# Patient Record
Sex: Male | Born: 1975 | Hispanic: No | Marital: Married | Smoking: Never smoker
Health system: Southern US, Community
[De-identification: ages and names within clinical notes are randomized; demographics above are authoritative.]

---

## 2015-05-29 ENCOUNTER — Ambulatory Visit (INDEPENDENT_AMBULATORY_CARE_PROVIDER_SITE_OTHER): Payer: Self-pay

## 2015-05-29 ENCOUNTER — Ambulatory Visit (INDEPENDENT_AMBULATORY_CARE_PROVIDER_SITE_OTHER): Payer: Self-pay | Admitting: Physician Assistant

## 2015-05-29 VITALS — BP 132/70 | HR 81 | Temp 98.7°F | Resp 16 | Ht 67.0 in | Wt 203.0 lb

## 2015-05-29 DIAGNOSIS — S62639A Displaced fracture of distal phalanx of unspecified finger, initial encounter for closed fracture: Secondary | ICD-10-CM

## 2015-05-29 DIAGNOSIS — S6991XA Unspecified injury of right wrist, hand and finger(s), initial encounter: Secondary | ICD-10-CM

## 2015-05-29 DIAGNOSIS — S62632A Displaced fracture of distal phalanx of right middle finger, initial encounter for closed fracture: Secondary | ICD-10-CM

## 2015-05-29 MED ORDER — CEPHALEXIN 500 MG PO CAPS: 500.0000 mg | ORAL_CAPSULE | Freq: Two times a day (BID) | ORAL | Status: AC

## 2015-05-29 NOTE — Progress Notes (Signed)
[facility] [address], [place] [id number] [phone]  Date:  05/29/2015   Name:  Jeremy Maxwell   DOB:  [birth year]   MRN:  [id number]  PCP:  No primary care provider on file.   Chief Complaint  Patient presents with  . Hand Pain    finger injury, smashed, a couple of days ago, right middle finger    History of Present Illness:  Jeremy Maxwell is a 40 y.o. male patient who [facility] cc of right middle finger pain. Language barrier, and wife is translating Patient states that about 2 weeks ago, he had smashed his middle finger between weights when they dropped down.  He had some bleeding, and increased pain in the beginning.  There was also swelling.  He has no numbness or tingling.  Became concerned when there was swelling and drainage.      There are no active problems to display for this patient.   History reviewed. No pertinent past medical history.  History reviewed. No pertinent past surgical history.  Social History  Substance Use Topics  . Smoking status: Never Smoker   . Smokeless tobacco: None  . Alcohol Use: None    No family history on file.  No Known Allergies  Medication list has been reviewed and updated.  No current outpatient prescriptions on file prior to visit.   No current facility-administered medications on file prior to visit.    ROS ROS otherwise unremarkable unless listed above.  Physical Examination: BP 132/70 mmHg  Pulse 81  Temp(Src) 98.7 F (37.1 C)  Resp 16  Ht 5\' 7"  (1.702 m)  Wt 203 lb (92.08 kg)  BMI 31.79 kg/m2  SpO2 97% Ideal Body Weight: Weight in (lb) to have BMI = 25: 159.3  Physical Exam  Constitutional: He is oriented to person, place, and time. He appears well-developed and well-nourished. No distress.  HENT:  Head: Normocephalic and atraumatic.  Eyes: Conjunctivae and EOM are normal. Pupils are equal, round, and reactive to light.  Cardiovascular: Normal rate.    Pulmonary/Chest: Effort normal. No respiratory distress.  Neurological: He is alert and oriented to person, place, and time.  Skin: Skin is warm and dry. He is not diaphoretic.  There is sloughing of the proximal nail bed epidermis.  Small subungal hematoma at the lateral side of nail bed.  Tenderness with palpation of the distal nail.  Normal range of motion of distal joint.  Psychiatric: He has a normal mood and affect. His behavior is normal.   Dg Finger Middle Right  05/29/2015  CLINICAL DATA:  Smashed middle finger between weights EXAM: RIGHT MIDDLE FINGER 2+V COMPARISON:  None. FINDINGS: Three views of the right third finger submitted. There is comminuted minimal displaced fracture at the tip of distal phalanx. IMPRESSION: Comminuted minimal displaced fracture at the tip of distal phalanx right third finger. Electronically Signed   By: Natasha MeadLiviu  Pop M.D.   On: 05/29/2015 18:48   Assessment and Plan: Jeremy Fabianvens Boateng is a 40 y.o. male who is here today for cc of finger pain. --distal fracture.  Will treat with keflex at this time.  Placed in distal finger splint.  Advised to wear for 4 weeks.  Advised to rtc in 2 weeks  Phalanx, distal fracture of finger, closed, initial encounter - Plan: cephALEXin (KEFLEX) 500 MG capsule  Finger injury, right, initial encounter - Plan: DG Finger Middle Right, cephALEXin (KEFLEX) 500 MG capsule  Trena PlattStephanie English, PA-C [facility] 05/29/2015 5:40 PM

## 2015-05-29 NOTE — Patient Instructions (Addendum)
Because you received an x-ray today, you will receive an invoice from [facility]. Please contact [facility] at [phone] with questions or concerns regarding your invoice. Our billing staff will not be able to assist you with those questions.  Please take the antibiotic to completion. We need to see you in 2 weeks for follow up of your fracture.  We will not need to xray, just to check on the progress of your finger pain.   You will keep the splint on your finger for 3-4 weeks, or until you are no longer sensitive with any pressure to the distal portion of your finger.    Finger Fracture Finger fractures are breaks in the bones of the fingers. There are many types of fractures. There are also different ways of treating these fractures. Your doctor will talk with you about the best way to treat your fracture. Injury is the main cause of broken fingers. This includes:  Injuries while playing sports.  Workplace injuries.  Falls. HOME CARE  Follow your doctor's instructions for:  Activities.  Exercises.  Physical therapy.  Take medicines only as told by your doctor for pain, discomfort, or fever. GET HELP IF: You have pain or swelling that limits:  The motion of your fingers.  The use of your fingers. GET HELP RIGHT AWAY IF:  You cannot feel your fingers, or your fingers become numb.   This information is not intended to replace advice given to you by your health care provider. Make sure you discuss any questions you have with your health care provider.   Document Released: 08/26/2007 Document Revised: 03/30/2014 Document Reviewed: 10/19/2012 Elsevier Interactive Patient Education [employer]

## 2016-09-22 IMAGING — CR DG FINGER MIDDLE 2+V*R*
1 series · 1 of 1 positions shown · non-contrast
Comparison: None.

CLINICAL DATA: Smashed middle finger between weights

EXAM:
RIGHT MIDDLE FINGER 2+V

[PA]
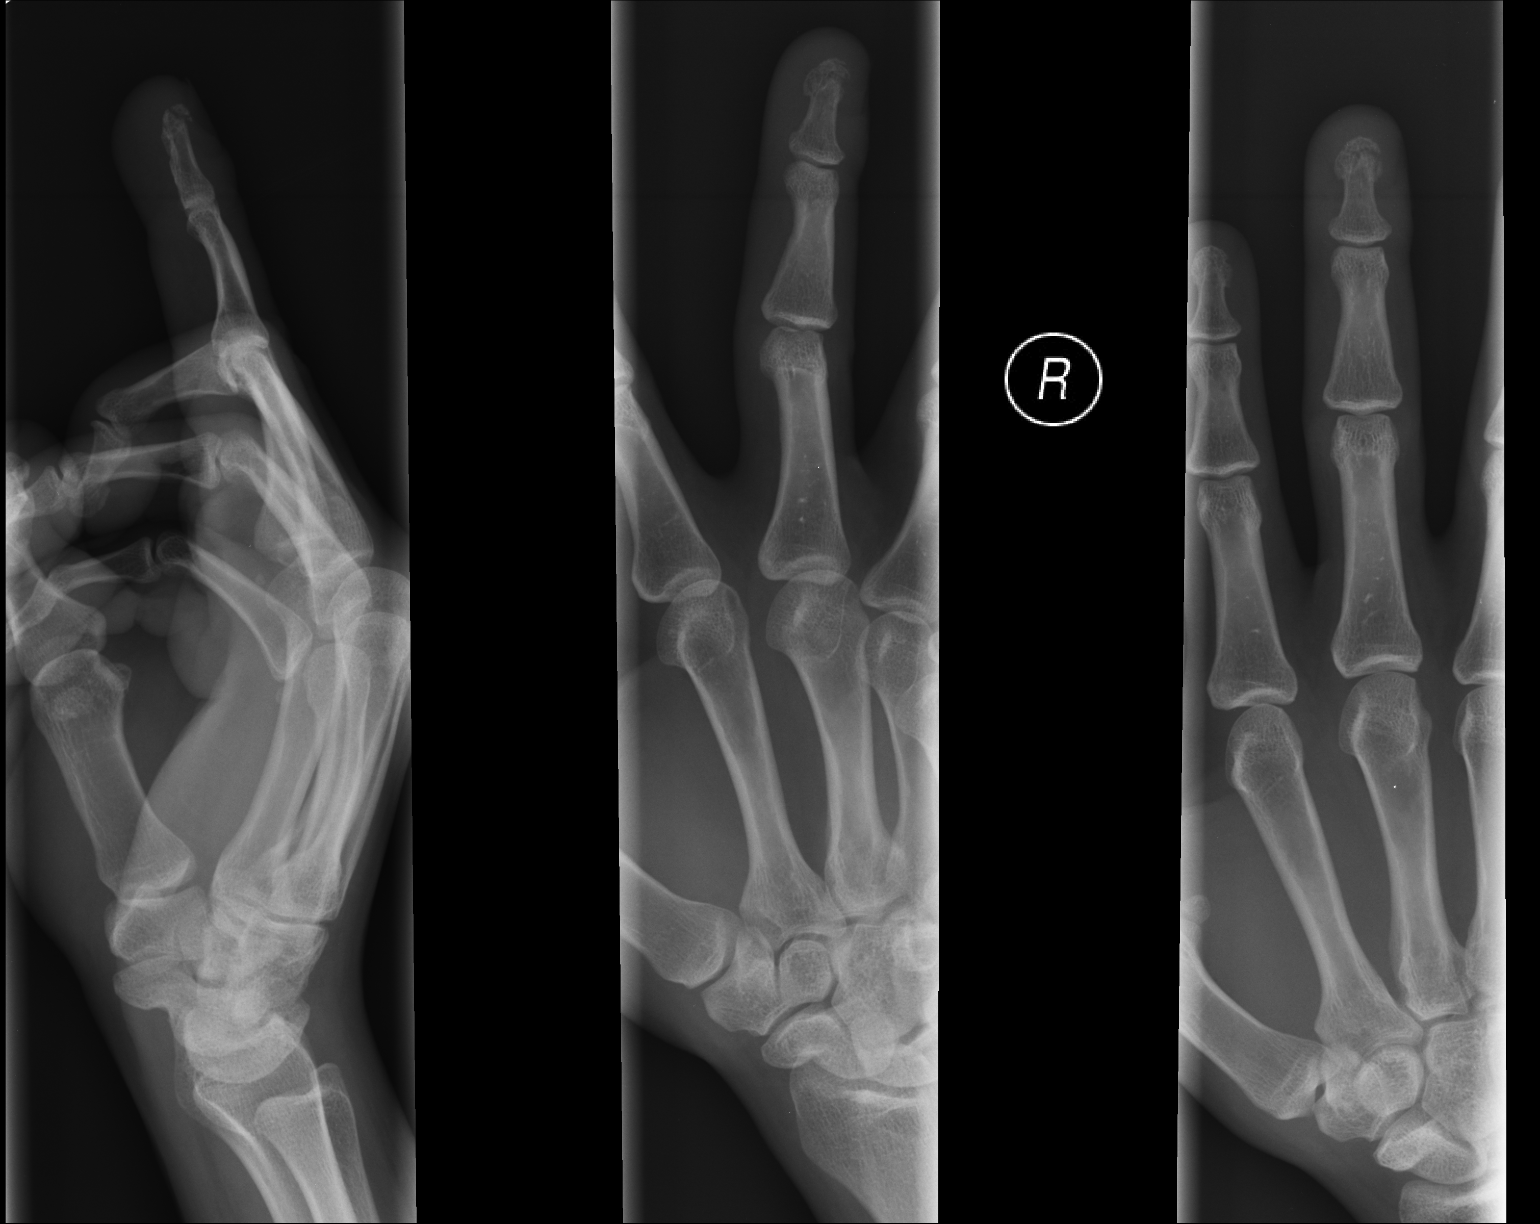

[1 of 1 positions shown; findings below may reference images not displayed]

FINDINGS: Three views of the right third finger submitted. There is comminuted
minimal displaced fracture at the tip of distal phalanx.
IMPRESSION: Comminuted minimal displaced fracture at the tip of distal phalanx
right third finger.
